# Patient Record
Sex: Male | Born: 1965 | Race: Black or African American | Hispanic: No | Marital: Married | Smoking: Never smoker
Health system: Southern US, Community
[De-identification: ages and names within clinical notes are randomized; demographics above are authoritative.]

---

## 2005-05-21 ENCOUNTER — Emergency Department (HOSPITAL_COMMUNITY): Admission: EM | Admit: 2005-05-21 | Discharge: 2005-05-21 | Payer: Self-pay | Admitting: Emergency Medicine

## 2005-07-24 ENCOUNTER — Emergency Department (HOSPITAL_COMMUNITY): Admission: EM | Admit: 2005-07-24 | Discharge: 2005-07-24 | Payer: Self-pay | Admitting: Emergency Medicine

## 2005-10-12 ENCOUNTER — Emergency Department (HOSPITAL_COMMUNITY): Admission: EM | Admit: 2005-10-12 | Discharge: 2005-10-12 | Payer: Self-pay | Admitting: Family Medicine

## 2005-10-14 ENCOUNTER — Emergency Department (HOSPITAL_COMMUNITY): Admission: EM | Admit: 2005-10-14 | Discharge: 2005-10-14 | Payer: Self-pay | Admitting: Family Medicine

## 2005-10-16 ENCOUNTER — Emergency Department (HOSPITAL_COMMUNITY): Admission: EM | Admit: 2005-10-16 | Discharge: 2005-10-16 | Payer: Self-pay | Admitting: Family Medicine

## 2007-09-26 ENCOUNTER — Emergency Department (HOSPITAL_COMMUNITY): Admission: EM | Admit: 2007-09-26 | Discharge: 2007-09-26 | Payer: Self-pay | Admitting: Family Medicine

## 2007-12-30 ENCOUNTER — Emergency Department (HOSPITAL_COMMUNITY): Admission: EM | Admit: 2007-12-30 | Discharge: 2007-12-30 | Payer: Self-pay | Admitting: Family Medicine

## 2008-01-16 ENCOUNTER — Emergency Department (HOSPITAL_COMMUNITY): Admission: EM | Admit: 2008-01-16 | Discharge: 2008-01-16 | Payer: Self-pay | Admitting: Emergency Medicine

## 2009-12-02 ENCOUNTER — Encounter: Admission: RE | Admit: 2009-12-02 | Discharge: 2009-12-02 | Payer: Self-pay | Admitting: Family Medicine

## 2010-03-22 IMAGING — US US SCROTUM
1 series · 14 of 25 positions shown · non-contrast
Comparison: None.

CLINICAL DATA: Palpable finding, suspect cyst.

ULTRASOUND OF SCROTUM
TECHNIQUE: Complete ultrasound examination of the testicles,
epididymis, and other scrotal structures was performed.

[Series 1: us scrotum · 0.06mm/px · 14 of 33 slices shown]
[im 1/33]
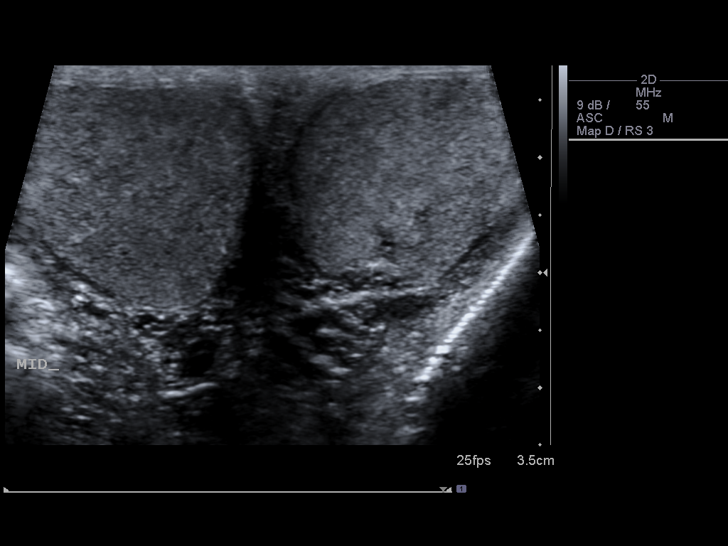
[im 3/33]
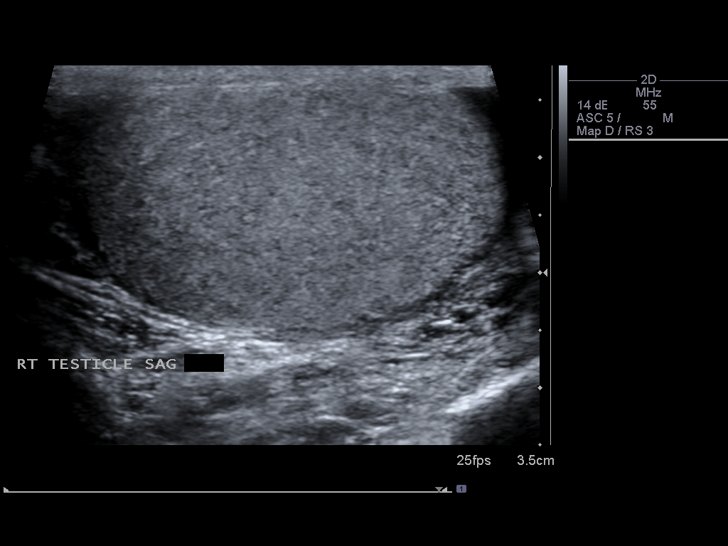
[im 6/33]
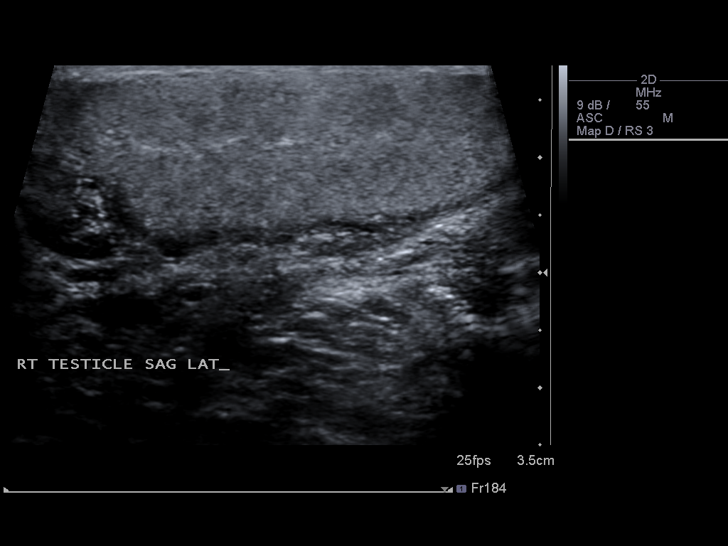
[im 9/33]
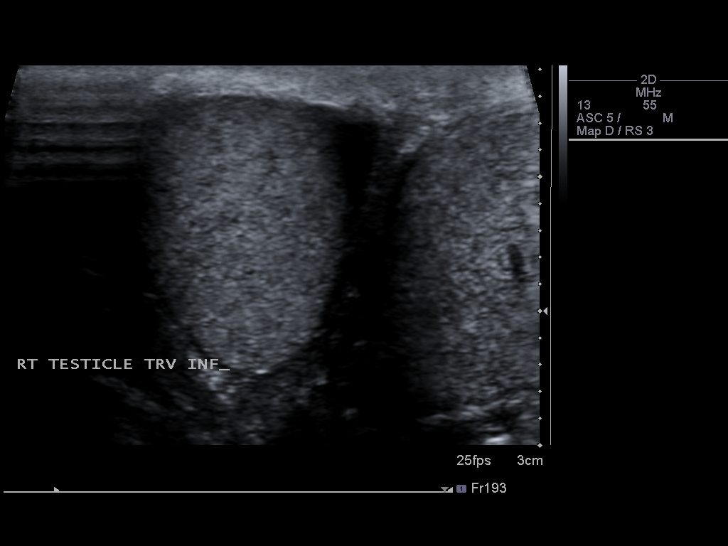
[im 11/33]
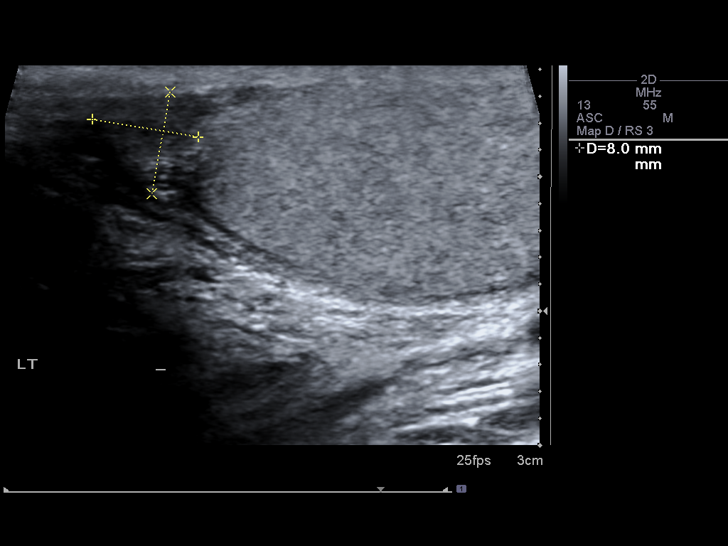
[im 13/33]
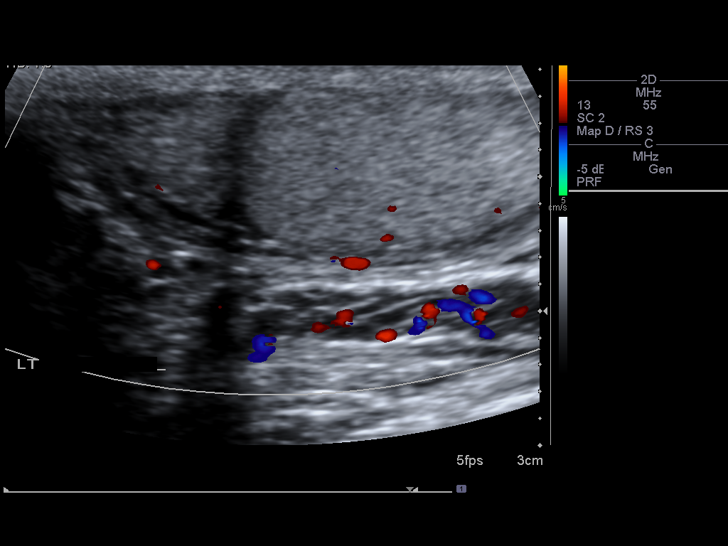
[im 15/33]
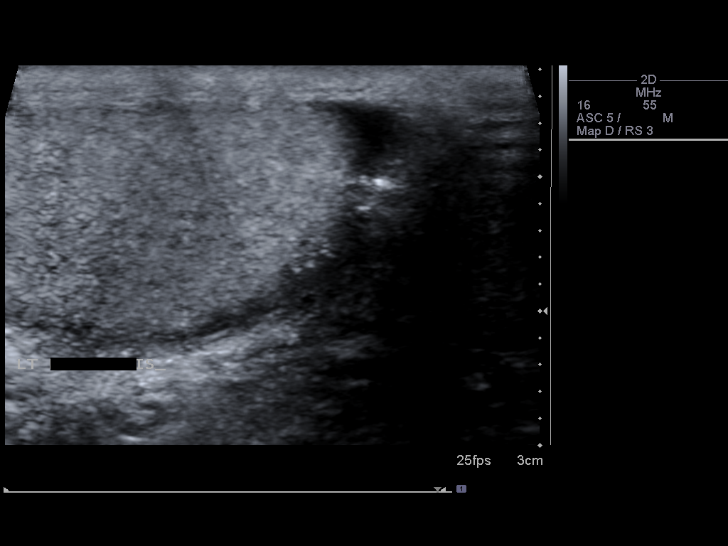
[im 18/33]
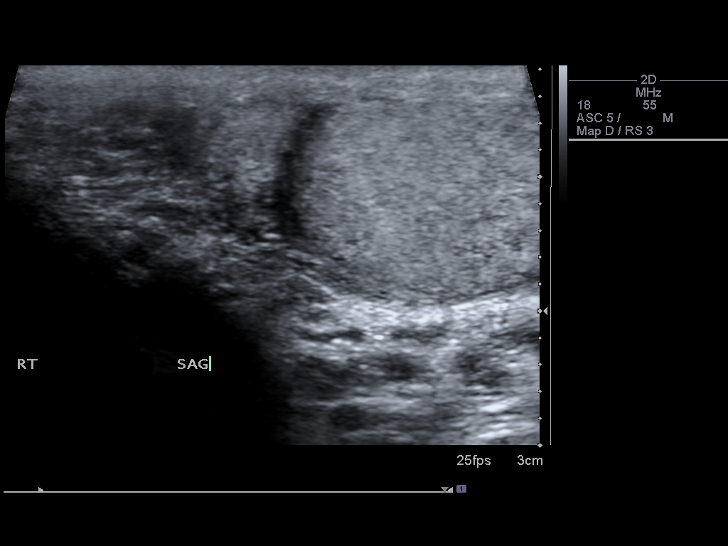
[im 21/33]
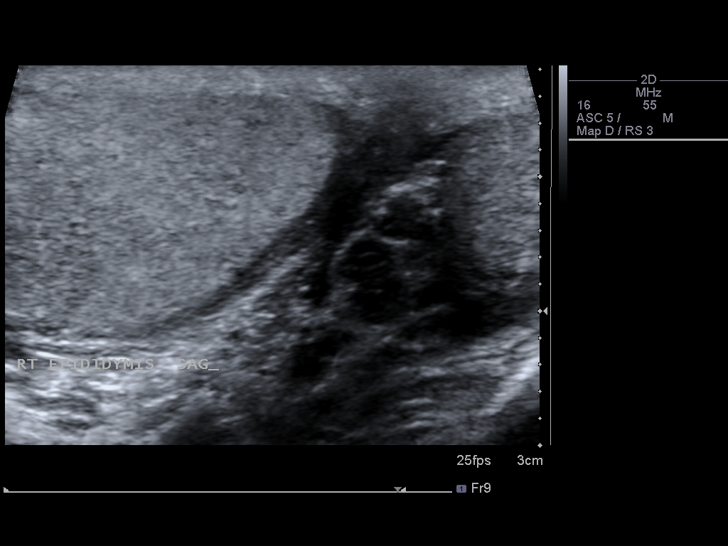
[im 22/33]
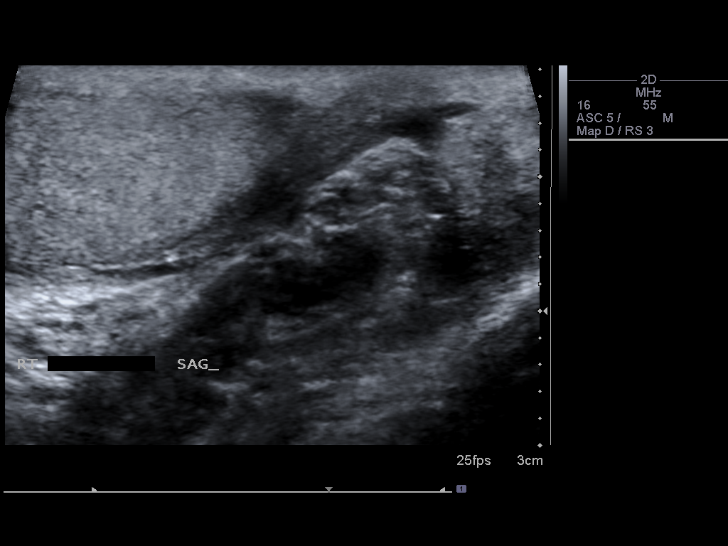
[im 25/33]
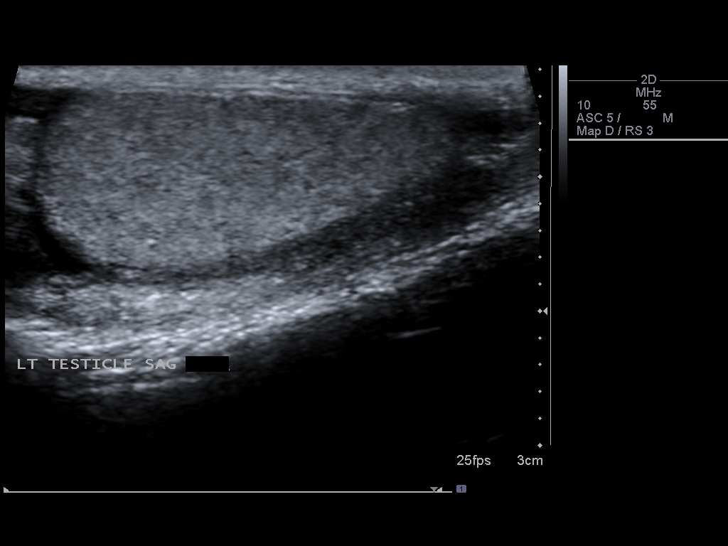
[im 27/33]
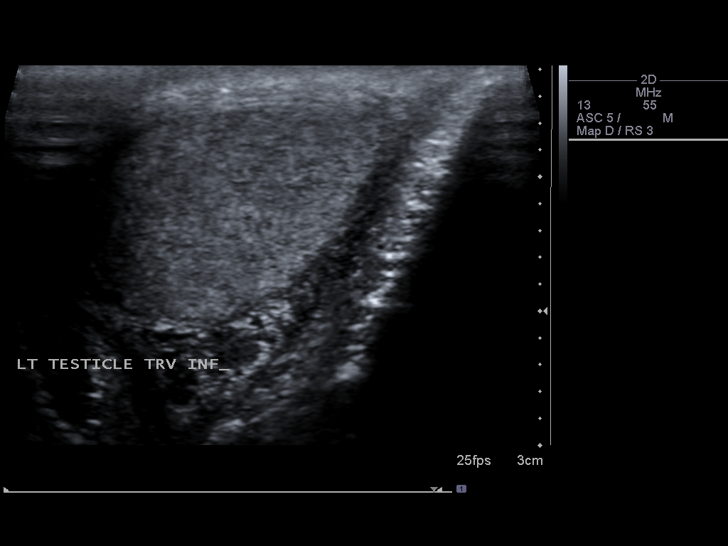
[im 30/33]
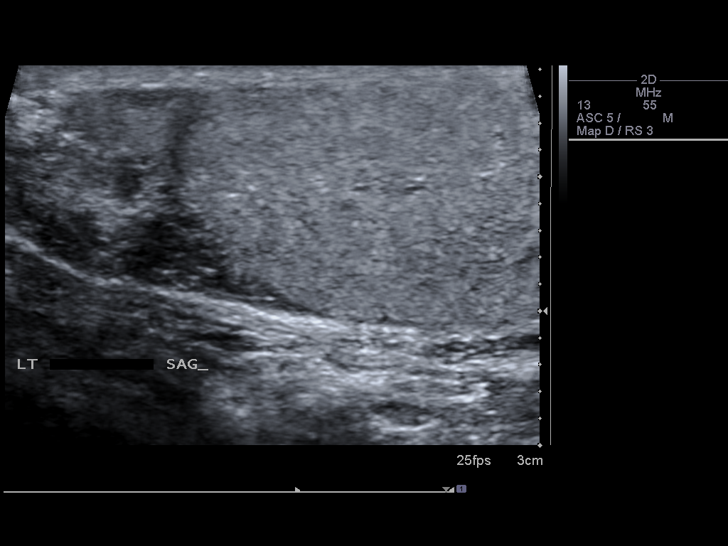
[im 33/33]
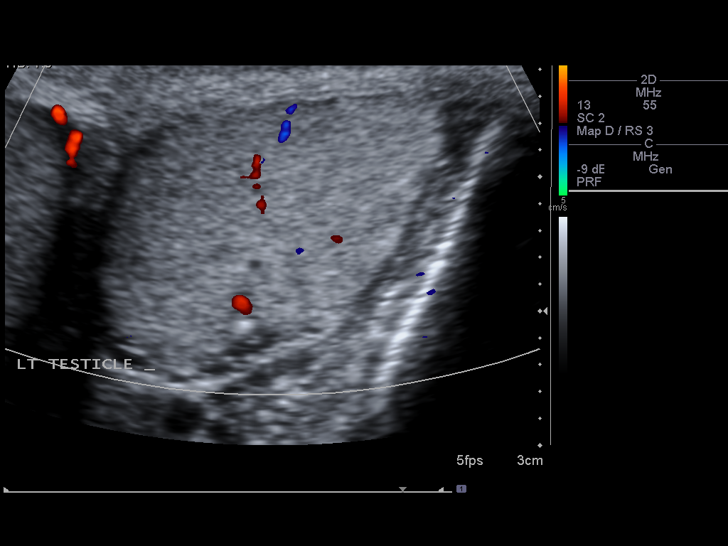

[14 of 25 positions shown; findings below may reference images not displayed]

FINDINGS: Bilateral testes appear sonographically normal with right
measuring 3.7 cm long X 2.2 cm AP X 2.3 cm wide and left 4.0 cm
long X 1.7 cm AP X 2.5 cm wide.  Bilateral color Doppler testicular
blood flow is symmetrical and normal-appearing.  Bilateral
epididymal heads appear normal in size with no focal lesions.  No
hydrocele or varicocele visualized.
IMPRESSION: Normal.

## 2011-07-23 LAB — INFLUENZA A AND B ANTIGEN (CONVERTED LAB)
Inflenza A Ag: NEGATIVE
Influenza B Ag: NEGATIVE

## 2016-01-10 ENCOUNTER — Encounter (HOSPITAL_COMMUNITY): Payer: Self-pay | Admitting: *Deleted

## 2016-01-10 ENCOUNTER — Emergency Department (HOSPITAL_COMMUNITY)
Admission: EM | Admit: 2016-01-10 | Discharge: 2016-01-10 | Disposition: A | Discharge status: Home / Self Care | Payer: Managed Care, Other (non HMO) | Source: Home / Self Care

## 2016-01-10 DIAGNOSIS — K611 Rectal abscess: Secondary | ICD-10-CM

## 2016-01-10 MED ORDER — LIDOCAINE HCL 2 % IJ SOLN
INTRAMUSCULAR | Status: AC
  Filled 2016-01-10: qty 20

## 2016-01-10 MED ORDER — CLINDAMYCIN HCL 150 MG PO CAPS: 150.0000 mg | ORAL_CAPSULE | Freq: Four times a day (QID) | ORAL | Status: DC

## 2016-01-10 NOTE — ED Notes (Signed)
Pt     Reports     painfull  Bleeding  hemmorhiods        intermittantly       For quite  A  While     pt  Reports  hemmoriods    Are    External    He  Reports  The pain is     Worse  After  Driving     Car  For  Distances

## 2016-01-10 NOTE — Discharge Instructions (Signed)
Perirectal Abscess An abscess is an infected area that contains a collection of pus. A perirectal abscess is an abscess that is near the opening of the anus or around the rectum. A perirectal abscess can cause a lot of pain, especially during bowel movements. CAUSES This condition is almost always caused by an infection that starts in an anal gland. RISK FACTORS This condition is more likely to develop in:  People with diabetes or inflammatory bowel disease.  People whose body defense system (immune system) is weak.  People who have anal sex.  People who have a sexually transmitted disease (STD).  People who have certain kinds of cancers, such as rectal carcinoma, leukemia, or lymphoma. SYMPTOMS The main symptom of this condition is pain. The pain may be a throbbing pain that gets worse during bowel movements. Other symptoms include:  Fever.  Swelling.  Redness.  Bleeding.  Constipation. DIAGNOSIS The condition is diagnosed with a physical exam. If the abscess is not visible, a health care provider may need to place a finger inside the rectum to find the abscess. Sometimes, imaging tests are done to determine the size and location of the abscess. These tests may include:  An ultrasound.  An MRI.  A CT scan. TREATMENT This condition is usually treated with incision and drainage surgery. Incision and drainage surgery involves making an incision over the abscess to drain the pus. Treatment may also involve antibiotic medicine, pain medicine, stool softeners, or laxatives. HOME CARE INSTRUCTIONS  Take medicines only as directed by your health care provider.  If you were prescribed an antibiotic, finish all of it even if you start to feel better.  To relieve pain, try sitting:  In a warm, shallow bath (sitz bath).  On a heating pad with the setting on low.  On an inflatable donut-shaped cushion.  Follow any diet instructions as directed by your health care  provider.  Keep all follow-up visits as directed by your health care provider. This is important. SEEK MEDICAL CARE IF:  Your abscess is bleeding.  You have pain, swelling, or redness that is getting worse.  You are constipated.  You feel ill.  You have muscle aches or chills.  You have a fever.  Your symptoms return after the abscess has healed.   This information is not intended to replace advice given to you by your health care provider. Make sure you discuss any questions you have with your health care provider.   Document Released: 10/15/2000 Document Revised: 07/09/2015 Document Reviewed: 08/28/2014 Elsevier Interactive Patient Education 2016 [employer]  

## 2016-01-10 NOTE — ED Provider Notes (Signed)
CSN: [id number]     Arrival date & time 01/10/16  1809 History   None    Chief Complaint  Patient presents with  . Hemorrhoids   (Consider location/radiation/quality/duration/timing/severity/associated sxs/prior Treatment) Patient is a 49 y.o. male presenting with abscess. The history is provided by the patient.  Abscess Location:  Ano-genital Ano-genital abscess location:  R buttock and anus Abscess quality: fluctuance and painful   Red streaking: no   Duration:  4 days Progression:  Worsening Pain details:    Quality:  Throbbing   Severity:  Severe   Duration:  4 days   Timing:  Constant   Progression:  Worsening Chronicity:  New Relieved by:  Nothing Worsened by:  Nothing tried Ineffective treatments:  None tried Risk factors: prior abscess     No past medical history on file. No past surgical history on file. No family history on file. Social History  Substance Use Topics  . Smoking status: Not on file  . Smokeless tobacco: Not on file  . Alcohol Use: Not on file    Review of Systems  Constitutional: Negative.   HENT: Negative.   Eyes: Negative.   Respiratory: Negative.   Cardiovascular: Negative.   Gastrointestinal: Negative.   Endocrine: Negative.   Genitourinary: Negative.   Skin: Positive for wound.       Peri rectal cyst/ abscess present  Allergic/Immunologic: Negative.   Hematological: Negative.   Psychiatric/Behavioral: Negative.     Allergies  Review of patient's allergies indicates not on file.  Home Medications   Prior to Admission medications   Not on File   Meds Ordered and Administered this Visit  Medications - No data to display  There were no vitals taken for this visit. No data found.   Physical Exam  Constitutional: He appears well-developed and well-nourished.  HENT:  Head: Normocephalic and atraumatic.  Right Ear: External ear normal.  Left Ear: External ear normal.  Mouth/Throat: Oropharynx is clear and moist.  Eyes:  Pupils are equal, round, and reactive to light.  Abdominal: Soft. Bowel sounds are normal.  Skin:  Perineum with cyst that is painful and about 3cm from anus.      ED Course  .Marland KitchenIncision and Drainage Date/Time: 01/10/2016 8:28 PM Performed by: Deatra Canter Authorized by: Deatra Canter Consent: Verbal consent obtained. Risks and benefits: risks, benefits and alternatives were discussed Consent given by: patient Patient understanding: patient states understanding of the procedure being performed Patient consent: the patient's understanding of the procedure matches consent given Procedure consent: procedure consent matches procedure scheduled Relevant documents: relevant documents present and verified Test results: test results available and properly labeled Site marked: the operative site was marked Imaging studies: imaging studies available Patient identity confirmed: verbally with patient Time out: Immediately prior to procedure a "time out" was called to verify the correct patient, procedure, equipment, support staff and site/side marked as required. Type: abscess Body area: anogenital Location details: perianal Anesthesia: local infiltration Local anesthetic: lidocaine 1% without epinephrine Anesthetic total: 3 ml Patient sedated: no Scalpel size: 11 Incision type: single straight Incision depth: subcutaneous Complexity: simple Drainage: purulent Drainage amount: 5ml. Wound treatment: wound left open Patient tolerance: Patient tolerated the procedure well with no immediate complications Comments: Wound opens with lidocaine administration and approximately 5ml of purulence and then incision is performed and approx 5 ml of serous sanguin drainage removed and then the wound is undermined with sterile curved hemostats and no interloculations were encountered.   (including critical care  time)  Labs Review Labs Reviewed - No data to display  Imaging Review No results  found.   Visual Acuity Review  Right Eye Distance:   Left Eye Distance:   Bilateral Distance:    Right Eye Near:   Left Eye Near:    Bilateral Near:         MDM  Peri-rectal cyst/abscess  Incision and drainage  Clindamycin 150mg  po qid x 7 days #28  Discussed follow up with surgery or surgical consult and he wants to wait  Not deep and will drain okay w/o packing so recommend 4x4 dressing for now and keep Dry for next 2 days and keep covered with dressing or bandaid.  Recommend 4x4 for 2 days then Go to bandaid. Follow up prn   Deatra CanterWilliam J Oxford, FNP 01/10/16 2034  Deatra CanterWilliam J Oxford, FNP 01/10/16 2039  Deatra CanterWilliam J Oxford, FNP 01/14/16 [phone]

## 2016-07-30 ENCOUNTER — Encounter (HOSPITAL_COMMUNITY): Payer: Self-pay | Admitting: *Deleted

## 2016-07-30 ENCOUNTER — Ambulatory Visit (HOSPITAL_COMMUNITY)
Admission: EM | Admit: 2016-07-30 | Discharge: 2016-07-30 | Disposition: A | Discharge status: Home / Self Care | Payer: Managed Care, Other (non HMO) | Attending: Family Medicine | Admitting: Family Medicine

## 2016-07-30 DIAGNOSIS — K6289 Other specified diseases of anus and rectum: Secondary | ICD-10-CM | POA: Diagnosis not present

## 2016-07-30 DIAGNOSIS — K611 Rectal abscess: Secondary | ICD-10-CM | POA: Diagnosis not present

## 2016-07-30 DIAGNOSIS — K625 Hemorrhage of anus and rectum: Secondary | ICD-10-CM | POA: Diagnosis present

## 2016-07-30 MED ORDER — SULFAMETHOXAZOLE-TRIMETHOPRIM 800-160 MG PO TABS: 1.0000 | ORAL_TABLET | Freq: Two times a day (BID) | ORAL | 1 refills | Status: AC

## 2016-07-30 NOTE — ED Provider Notes (Signed)
[facility]    CSN: [id number] Arrival date & time: 07/30/16  1702     History   Chief Complaint Chief Complaint  Patient presents with  . Rectal Bleeding    HPI Wesley Higgins is a 50 y.o. male.   The history is provided by the patient.  Rectal Bleeding  Quality:  Unable to specify Amount:  Scant Duration:  4 days Chronicity:  Recurrent Context: rectal pain   Pain details:    Quality:  Sharp   Severity:  Mild   Duration:  4 days Similar prior episodes: yes   Relieved by:  Nothing Worsened by:  Nothing Ineffective treatments:  None tried Associated symptoms: no abdominal pain and no fever     History reviewed. No pertinent past medical history.  There are no active problems to display for this patient.   History reviewed. No pertinent surgical history.     Home Medications    Prior to Admission medications   Medication Sig Start Date End Date Taking? Authorizing Provider  clindamycin (CLEOCIN) 150 MG capsule Take 1 capsule (150 mg total) by mouth every 6 (six) hours. 01/10/16   Deatra CanterWilliam J Oxford, FNP    Family History History reviewed. No pertinent family history.  Social History Social History  Substance Use Topics  . Smoking status: Never Smoker  . Smokeless tobacco: Not on file  . Alcohol use No     Allergies   Review of patient's allergies indicates no known allergies.   Review of Systems Review of Systems  Constitutional: Negative.  Negative for fever.  Gastrointestinal: Positive for hematochezia and rectal pain. Negative for abdominal pain.     Physical Exam Triage Vital Signs ED Triage Vitals [07/30/16 1750]  Enc Vitals Group     BP 120/80     Pulse Rate 78     Resp 18     Temp 98.6 F (37 C)     Temp src      SpO2 100 %     Weight      Height      Head Circumference      Peak Flow      Pain Score      Pain Loc      Pain Edu?      Excl. in GC?    No data found.   Updated Vital Signs BP 120/80 (BP Location:  Right Arm)   Pulse 78   Temp 98.6 F (37 C)   Resp 18   SpO2 100%   Visual Acuity Right Eye Distance:   Left Eye Distance:   Bilateral Distance:    Right Eye Near:   Left Eye Near:    Bilateral Near:     Physical Exam  Constitutional: He is oriented to person, place, and time. He appears well-developed and well-nourished. No distress.  Genitourinary:  Genitourinary Comments: Pustular 5mm lesion left perianal buttock, nonhemorroidal.  Neurological: He is alert and oriented to person, place, and time.  Nursing note and vitals reviewed.    UC Treatments / Results  Labs (all labs ordered are listed, but only abnormal results are displayed) Labs Reviewed - No data to display  EKG  EKG Interpretation None       Radiology No results found.  Procedures .Marland Kitchen.Incision and Drainage Date/Time: 07/30/2016 6:12 PM Performed by: Bradd CanaryKINDL, Matei Magnone D Authorized by: Bradd CanaryKINDL, Shereta Crothers D   Consent:    Consent obtained:  Verbal   Consent given by:  Patient  Alternatives discussed:  No treatment Location:    Type:  Cyst   Location:  Anogenital   Anogenital location:  Perianal Anesthesia (see MAR for exact dosages):    Anesthesia method:  None Procedure type:    Complexity:  Simple Procedure details:    Incision types:  Stab incision   Scalpel blade:  11   Drainage:  Purulent   Drainage amount:  Scant   Wound treatment:  Wound left open   Packing materials:  None Post-procedure details:    Patient tolerance of procedure:  Tolerated well, no immediate complications   (including critical care time)  Medications Ordered in UC Medications - No data to display   Initial Impression / Assessment and Plan / UC Course  I have reviewed the triage vital signs and the nursing notes.  Pertinent labs & imaging results that were available during my care of the patient were reviewed by me and considered in my medical decision making (see chart for details).  Clinical Course     Final  Clinical Impressions(s) / UC Diagnoses   Final diagnoses:  None    New Prescriptions New Prescriptions   No medications on file     Linna Hoff, MD 07/30/16 1816

## 2016-07-30 NOTE — Discharge Instructions (Signed)
Warm soak twice a day when you take antibiotic, return as needed.

## 2016-07-30 NOTE — ED Triage Notes (Signed)
Pt  Reports    Rectal  Pain        With   Symptoms        For  sev  Days     He  States         Pain      And     The   Symptoms   Are      Worse       The pt       Has     Had  hemmoriods     In  past

## 2016-08-02 LAB — AEROBIC CULTURE  (SUPERFICIAL SPECIMEN): CULTURE: NORMAL

## 2016-08-02 LAB — AEROBIC CULTURE W GRAM STAIN (SUPERFICIAL SPECIMEN): Special Requests: NORMAL

## 2017-05-05 ENCOUNTER — Encounter (HOSPITAL_BASED_OUTPATIENT_CLINIC_OR_DEPARTMENT_OTHER): Payer: Self-pay | Admitting: Emergency Medicine

## 2017-05-05 ENCOUNTER — Emergency Department (HOSPITAL_BASED_OUTPATIENT_CLINIC_OR_DEPARTMENT_OTHER)
Admission: EM | Admit: 2017-05-05 | Discharge: 2017-05-05 | Disposition: A | Discharge status: Home / Self Care | Payer: BLUE CROSS/BLUE SHIELD | Attending: Emergency Medicine | Admitting: Emergency Medicine

## 2017-05-05 DIAGNOSIS — K611 Rectal abscess: Secondary | ICD-10-CM | POA: Diagnosis not present

## 2017-05-05 DIAGNOSIS — K649 Unspecified hemorrhoids: Secondary | ICD-10-CM | POA: Diagnosis present

## 2017-05-05 MED ORDER — LIDOCAINE-EPINEPHRINE (PF) 2 %-1:200000 IJ SOLN
10.0000 mL | Freq: Once | INTRAMUSCULAR | Status: AC
  Administered 2017-05-05: 10 mL
  Filled 2017-05-05: qty 10

## 2017-05-05 NOTE — Discharge Instructions (Signed)
Please read and follow all provided instructions.  Your diagnoses today include:  1. Perirectal abscess     Tests performed today include:  Vital signs. See below for your results today.   Medications prescribed:   None  Take any prescribed medications only as directed.   Home care instructions:   Follow any educational materials contained in this packet  Follow-up instructions: Return to the Emergency Department in 48 hours for a recheck if your symptoms are not significantly improved.  Return instructions:  Return to the Emergency Department if you have:  Fever  Worsening symptoms  Worsening pain  Worsening swelling  Redness of the skin that moves away from the affected area, especially if it streaks away from the affected area   Any other emergent concerns   Your vital signs today were: BP 129/79    Pulse 82    Temp 98.4 F (36.9 C) (Oral)    Resp 18    Wt 80.7 kg (178 lb)    SpO2 100%  If your blood pressure (BP) was elevated above 135/85 this visit, please have this repeated by your doctor within one month. --------------

## 2017-05-05 NOTE — ED Provider Notes (Signed)
[facility]   CSN: [id number] Arrival date & time: 05/05/17  2111  By signing my name below, I, Vista Mink, attest that this documentation has been prepared under the direction and in the presence of Renne Crigler PA-C  Electronically Signed: Vista Mink, ED Scribe. 05/05/17. 10:06 PM.   History   Chief Complaint Chief Complaint  Patient presents with  . Hemorrhoids    HPI HPI Comments: Wesley Higgins is a 51 y.o. male who presents to the Emergency Department complaining of gradually worsening area of pain and swelling under left gluteal cleft that started three days ago. Pt has Hx of similar perirectal abscess's, last one six months ago. He reports noting bloody drainage from the area during this current episode. His pain is exacerbated during bowel movements. No treatments tried at home.   The history is provided by the patient. No language interpreter was used.   History reviewed. No pertinent past medical history.  There are no active problems to display for this patient.  History reviewed. No pertinent surgical history.   Home Medications    Prior to Admission medications   Not on File   Family History History reviewed. No pertinent family history.  Social History Social History  Substance Use Topics  . Smoking status: Never Smoker  . Smokeless tobacco: Never Used  . Alcohol use No    Allergies   Patient has no known allergies.   Review of Systems Review of Systems  Constitutional: Negative for fever.  Gastrointestinal: Negative for blood in stool, nausea and vomiting.  Skin: Positive for wound (abscess to left gluteal cleft). Negative for color change.       Positive for abscess  Hematological: Negative for adenopathy.    Physical Exam Updated Vital Signs BP 129/79   Pulse 82   Temp 98.4 F (36.9 C) (Oral)   Resp 18   Wt 178 lb (80.7 kg)   SpO2 100%   Physical Exam  Constitutional: He is oriented to person, place, and  time. He appears well-developed and well-nourished. No distress.  HENT:  Head: Normocephalic and atraumatic.  Eyes: Conjunctivae are normal.  Neck: Normal range of motion. Neck supple.  Pulmonary/Chest: Effort normal. No respiratory distress.  Genitourinary:  Genitourinary Comments: 2 cm superficial abscess noted to the medial left buttock, consistent with mild perirectal abscess. No active drainage. Area moderately tender.  Neurological: He is alert and oriented to person, place, and time.  Skin: Skin is warm and dry. He is not diaphoretic.  Psychiatric: He has a normal mood and affect. Judgment normal.  Nursing note and vitals reviewed.    ED Treatments / Results  DIAGNOSTIC STUDIES: Oxygen Saturation is 100% on RA, normal by my interpretation.  COORDINATION OF CARE: 10:06 PM-Will I&D. Discussed treatment plan with pt at bedside and pt agreed to plan.   Procedures Procedures (including critical care time)  Medications Ordered in ED Medications - No data to display   Initial Impression / Assessment and Plan / ED Course  I have reviewed the triage vital signs and the nursing notes.  Pertinent labs & imaging results that were available during my care of the patient were reviewed by me and considered in my medical decision making (see chart for details).     Vital signs reviewed and are as follows: Vitals:   05/05/17 2116  BP: 129/79  Pulse: 82  Resp: 18  Temp: 98.4 F (36.9 C)   INCISION AND DRAINAGE Performed by: Carolee Rota  Consent: Verbal consent obtained. Risks and benefits: risks, benefits and alternatives were discussed Type: abscess  Body area: L buttock  Anesthesia: local infiltration  Incision was made with a scalpel.  Local anesthetic: lidocaine 2% with epinephrine  Anesthetic total: 2 ml  Complexity: complex Blunt dissection to break up loculations  Drainage: purulent  Drainage amount: moderate  Packing material: none  Patient  tolerance: Patient tolerated the procedure well with no immediate complications.  Patient counseled on warm soaks at home. The patient was urged to return to the Emergency Department urgently with worsening pain, swelling, expanding erythema especially if it streaks away from the affected area, fever, or if they have any other concerns.   The patient was urged to return to the Emergency Department or go to their PCP in 48 hours for wound recheck if the area is not significantly improved.  The patient verbalized understanding and stated agreement with this plan.      Final Clinical Impressions(s) / ED Diagnoses   Final diagnoses:  Perirectal abscess   Patient with a small, superficial abscess noted. Drained without complication. Does not involve sphincter. Doubt significant tracking or fistula.   New Prescriptions There are no discharge medications for this patient. I personally performed the services described in this documentation, which was scribed in my presence. The recorded information has been reviewed and is accurate.     Renne CriglerGeiple, Arely Tinner, PA-C 05/06/17 Mariann [id number]    Derwood KaplanNanavati, Ankit, MD 05/06/17 [phone]

## 2017-05-05 NOTE — ED Notes (Signed)
Pt verbalizes understanding of d/c instructions and denies any further needs at this time. 

## 2019-03-31 ENCOUNTER — Encounter (HOSPITAL_BASED_OUTPATIENT_CLINIC_OR_DEPARTMENT_OTHER): Payer: Self-pay | Admitting: Emergency Medicine

## 2019-03-31 ENCOUNTER — Other Ambulatory Visit: Payer: Self-pay

## 2019-03-31 ENCOUNTER — Emergency Department (HOSPITAL_BASED_OUTPATIENT_CLINIC_OR_DEPARTMENT_OTHER): Payer: BLUE CROSS/BLUE SHIELD

## 2019-03-31 ENCOUNTER — Emergency Department (HOSPITAL_BASED_OUTPATIENT_CLINIC_OR_DEPARTMENT_OTHER)
Admission: EM | Admit: 2019-03-31 | Discharge: 2019-03-31 | Disposition: A | Discharge status: Home / Self Care | Payer: BLUE CROSS/BLUE SHIELD | Attending: Emergency Medicine | Admitting: Emergency Medicine

## 2019-03-31 DIAGNOSIS — Z20828 Contact with and (suspected) exposure to other viral communicable diseases: Secondary | ICD-10-CM | POA: Diagnosis not present

## 2019-03-31 DIAGNOSIS — B370 Candidal stomatitis: Secondary | ICD-10-CM | POA: Insufficient documentation

## 2019-03-31 DIAGNOSIS — R509 Fever, unspecified: Secondary | ICD-10-CM | POA: Diagnosis present

## 2019-03-31 LAB — COMPREHENSIVE METABOLIC PANEL
ALT: 20 U/L (ref 0–44)
AST: 22 U/L (ref 15–41)
Albumin: 4 g/dL (ref 3.5–5.0)
Alkaline Phosphatase: 50 U/L (ref 38–126)
Anion gap: 7 (ref 5–15)
BUN: 19 mg/dL (ref 6–20)
CO2: 23 mmol/L (ref 22–32)
Calcium: 8.4 mg/dL — ABNORMAL LOW (ref 8.9–10.3)
Chloride: 108 mmol/L (ref 98–111)
Creatinine, Ser: 1.13 mg/dL (ref 0.61–1.24)
GFR calc Af Amer: >60 mL/min (ref >60)
GFR calc non Af Amer: >60 mL/min (ref >60)
Glucose, Bld: 91 mg/dL (ref 70–99)
Potassium: 3.7 mmol/L (ref 3.5–5.1)
Sodium: 138 mmol/L (ref 135–145)
Total Bilirubin: 0.5 mg/dL (ref 0.3–1.2)
Total Protein: 7.5 g/dL (ref 6.5–8.1)

## 2019-03-31 LAB — CBC WITH DIFFERENTIAL/PLATELET
Abs Immature Granulocytes: 0 10*3/uL (ref 0.00–0.07)
Basophils Absolute: 0 10*3/uL (ref 0.0–0.1)
Basophils Relative: 1 %
Eosinophils Absolute: 0.4 10*3/uL (ref 0.0–0.5)
Eosinophils Relative: 7 %
HCT: 45.5 % (ref 39.0–52.0)
Hemoglobin: 14.7 g/dL (ref 13.0–17.0)
Immature Granulocytes: 0 %
Lymphocytes Relative: 62 %
Lymphs Abs: 3.3 10*3/uL (ref 0.7–4.0)
MCH: 28.9 pg (ref 26.0–34.0)
MCHC: 32.3 g/dL (ref 30.0–36.0)
MCV: 89.4 fL (ref 80.0–100.0)
Monocytes Absolute: 0.3 10*3/uL (ref 0.1–1.0)
Monocytes Relative: 6 %
Neutro Abs: 1.3 10*3/uL — ABNORMAL LOW (ref 1.7–7.7)
Neutrophils Relative %: 24 %
Platelets: 148 10*3/uL — ABNORMAL LOW (ref 150–400)
RBC: 5.09 MIL/uL (ref 4.22–5.81)
RDW: 13.4 % (ref 11.5–15.5)
WBC: 5.3 10*3/uL (ref 4.0–10.5)
nRBC: 0 % (ref 0.0–0.2)

## 2019-03-31 LAB — LACTIC ACID, PLASMA: Lactic Acid, Venous: 0.9 mmol/L (ref 0.5–1.9)

## 2019-03-31 LAB — SARS CORONAVIRUS 2 AG (30 MIN TAT): SARS Coronavirus 2 Ag: NEGATIVE

## 2019-03-31 MED ORDER — SODIUM CHLORIDE 0.9 % IV BOLUS
1000.0000 mL | Freq: Once | INTRAVENOUS | Status: AC
  Administered 2019-03-31: 1000 mL via INTRAVENOUS

## 2019-03-31 MED ORDER — NYSTATIN 100000 UNIT/ML MT SUSP: 5.0000 mL | Freq: Three times a day (TID) | OROMUCOSAL | 0 refills | Status: AC | PRN

## 2019-03-31 NOTE — Discharge Instructions (Signed)
Your COVID testing is negative today and your chest xray showed no pneumonia   You have mild oral thrush. Try nystatin three times daily as needed. Please swish and spit   See your doctor  Return to ER if you have worse mouth pain, oral thrush, sore throat, fever > 101, trouble breathing

## 2019-03-31 NOTE — ED Provider Notes (Signed)
[facility] EMERGENCY DEPARTMENT Provider Note   CSN: [id number] Arrival date & time: 03/31/19  1501    History   Chief Complaint Chief Complaint  Patient presents with   Sore Throat   Fever    HPI Wesley Higgins is a 53 y.o. male here presenting with mouth pain, subjective fevers, cough.  Patient states that he has some pain on his tongue and may have some mouth ulcers for the last several days.  He has some subjective fevers but has been checking his temperature at work every day and he has remained afebrile.  He has some productive cough as well.  He went to a [employer] today and had his teeth cleaned and was noted to have some oral thrush.  He also has some subjective shortness of breath and cough so was sent here for possible pneumonia versus COVID infection.  Patient denies any sick contacts or known COVID contacts.      The history is provided by the patient.    History reviewed. No pertinent past medical history.  There are no active problems to display for this patient.   History reviewed. No pertinent surgical history.      Home Medications    Prior to Admission medications   Not on File    Family History History reviewed. No pertinent family history.  Social History Social History   Tobacco Use   Smoking status: Never Smoker   Smokeless tobacco: Never Used  Substance Use Topics   Alcohol use: No   Drug use: No     Allergies   Patient has no known allergies.   Review of Systems Review of Systems  Constitutional: Positive for fever.  Respiratory: Positive for cough.   All other systems reviewed and are negative.    Physical Exam Updated Vital Signs BP 131/90 (BP Location: Right Arm)    Pulse 74    Temp 98.6 F (37 C) (Oral)    Resp 16    SpO2 99%   Physical Exam Vitals signs and nursing note reviewed.  Constitutional:      Appearance: He is well-developed.  HENT:     Head: Normocephalic.     Right Ear: Tympanic  membrane normal.     Left Ear: Tympanic membrane normal.     Mouth/Throat:     Mouth: Mucous membranes are moist.     Comments: ? Mild oral thrush on the tongue, posterior pharynx normal  Eyes:     Conjunctiva/sclera: Conjunctivae normal.     Pupils: Pupils are equal, round, and reactive to light.  Neck:     Musculoskeletal: Normal range of motion and neck supple.  Cardiovascular:     Rate and Rhythm: Normal rate and regular rhythm.     Heart sounds: Normal heart sounds.  Pulmonary:     Effort: Pulmonary effort is normal.     Comments: Diminished breath sounds L base  Abdominal:     General: Bowel sounds are normal.     Palpations: Abdomen is soft.  Skin:    General: Skin is warm.     Capillary Refill: Capillary refill takes less than 2 seconds.  Neurological:     General: No focal deficit present.     Mental Status: He is alert and oriented to person, place, and time.  Psychiatric:        Mood and Affect: Mood normal.        Behavior: Behavior normal.      ED Treatments /  Results  Labs (all labs ordered are listed, but only abnormal results are displayed) Labs Reviewed  CBC WITH DIFFERENTIAL/PLATELET - Abnormal; Notable for the following components:      Result Value   Platelets 148 (*)    Neutro Abs 1.3 (*)    All other components within normal limits  COMPREHENSIVE METABOLIC PANEL - Abnormal; Notable for the following components:   Calcium 8.4 (*)    All other components within normal limits  SARS CORONAVIRUS 2 (HOSP ORDER, PERFORMED IN  LAB VIA ABBOTT ID)  CULTURE, BLOOD (ROUTINE X 2)  CULTURE, BLOOD (ROUTINE X 2)  NOVEL CORONAVIRUS, NAA (HOSPITAL ORDER, SEND-OUT TO REF LAB)  LACTIC ACID, PLASMA  LACTIC ACID, PLASMA    EKG None  Radiology Dg Chest Port 1 View  Result Date: 03/31/2019 CLINICAL DATA:  Cough EXAM: PORTABLE CHEST 1 VIEW COMPARISON:  None. FINDINGS: Heart and mediastinal contours are within normal limits. No focal opacities or  effusions. No acute bony abnormality. IMPRESSION: No active disease. Electronically Signed   By: Charlett NoseKevin  Dover M.D.   On: 03/31/2019 16:17    Procedures Procedures (including critical care time)  Medications Ordered in ED Medications  sodium chloride 0.9 % bolus 1,000 mL (1,000 mLs Intravenous New Bag/Given 03/31/19 1551)     Initial Impression / Assessment and Plan / ED Course  I have reviewed the triage vital signs and the nursing notes.  Pertinent labs & imaging results that were available during my care of the patient were reviewed by me and considered in my medical decision making (see chart for details).       Wesley Rocky LinkKen is a 53 y.o. male here with cough, subjective fever. Afebrile, not hypoxic. Low suspicion for COVID or pneumonia. Will get COVID testing, labs, CXR. He has mild oral thrush which can contribute to his low grade temp at home. Will dc home with nystatin.   4:59 PM WBC nl. CXR clear. COVID negative. Low grade temp likely from oral thrush. Will dc home with nystatin.   [name] was evaluated in Emergency Department on 03/31/2019 for the symptoms described in the history of present illness. He was evaluated in the context of the global COVID-19 pandemic, which necessitated consideration that the patient might be at risk for infection with the SARS-CoV-2 virus that causes COVID-19. Institutional protocols and algorithms that pertain to the evaluation of patients at risk for COVID-19 are in a state of rapid change based on information released by regulatory bodies including the CDC and federal and state organizations. These policies and algorithms were followed during the patient's care in the ED.   Final Clinical Impressions(s) / ED Diagnoses   Final diagnoses:  None    ED Discharge Orders    None       Charlynne PanderYao, Aricela Bertagnolli Hsienta, MD 03/31/19 1700

## 2019-03-31 NOTE — ED Triage Notes (Signed)
Pt here from urgent care stating that he has pneumonia, but endorses sore throat and fevers x 2 days.

## 2019-04-05 LAB — CULTURE, BLOOD (ROUTINE X 2)
Culture: NO GROWTH
Culture: NO GROWTH
Special Requests: ADEQUATE
Special Requests: ADEQUATE
